# Patient Record
Sex: Female | Born: 2000 | Race: White | Hispanic: No | Marital: Single | Smoking: Never smoker
Health system: Southern US, Community
[De-identification: ages and names within clinical notes are randomized; demographics above are authoritative.]

## PROBLEM LIST (undated history)

## (undated) DIAGNOSIS — N390 Urinary tract infection, site not specified: Secondary | ICD-10-CM

## (undated) DIAGNOSIS — J302 Other seasonal allergic rhinitis: Secondary | ICD-10-CM

## (undated) HISTORY — PX: WISDOM TOOTH EXTRACTION: SHX21

---
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
## 2019-01-16 ENCOUNTER — Encounter: Payer: Self-pay | Admitting: Gastroenterology

## 2019-02-28 ENCOUNTER — Ambulatory Visit: Payer: Self-pay | Admitting: Gastroenterology

## 2019-03-28 ENCOUNTER — Ambulatory Visit: Payer: Self-pay | Admitting: Gastroenterology

## 2019-04-15 ENCOUNTER — Encounter: Payer: Self-pay | Admitting: Family Medicine

## 2021-05-25 ENCOUNTER — Encounter: Payer: BLUE CROSS/BLUE SHIELD | Admitting: Radiology

## 2021-06-02 ENCOUNTER — Encounter: Payer: BLUE CROSS/BLUE SHIELD | Admitting: Radiology

## 2021-06-24 ENCOUNTER — Encounter: Payer: Self-pay | Admitting: Radiology

## 2021-06-24 ENCOUNTER — Ambulatory Visit: Payer: 59 | Admitting: Radiology

## 2021-06-24 VITALS — BP 120/76 | Ht 66.0 in

## 2021-06-24 DIAGNOSIS — R102 Pelvic and perineal pain: Secondary | ICD-10-CM | POA: Diagnosis not present

## 2021-06-24 LAB — URINALYSIS, COMPLETE W/RFL CULTURE
Casts: NONE SEEN /LPF
Crystals: NONE SEEN /HPF
Hgb urine dipstick: NEGATIVE
Leukocyte Esterase: NEGATIVE
Specific Gravity, Urine: 1.02 (ref 1.001–1.035)
Yeast: NONE SEEN /HPF

## 2021-06-24 NOTE — Progress Notes (Signed)
   Laurie Diaz [birth year] [id number]   History:  21 y.o. transfer from [facility] last seen by me 7/22 for AEX. She complains of worsening cramping and bloating with periods over the past 3 months.   Gynecologic History Patient's last menstrual period was 06/10/2021 (exact date). Period Cycle (Days): 28 Period Duration (Days): 6 Period Pattern: Regular Menstrual Flow: Heavy Dysmenorrhea: (!) Severe Dysmenorrhea Symptoms: Cramping Contraception/Family planning: abstinence Sexually active: no Last Pap: 2022. Results were: normal   Obstetric History OB History  Gravida Para Term Preterm AB Living  0 0 0 0 0 0  SAB IAB Ectopic Multiple Live Births  0 0 0 0 0     The following portions of the patient's history were reviewed and updated as appropriate: allergies, current medications, past family history, past medical history, past social history, past surgical history, and problem list.  Review of Systems Pertinent items noted in HPI and remainder of comprehensive ROS otherwise negative.   Past medical history, past surgical history, family history and social history were all reviewed and documented in the EPIC chart.   Exam:  Vitals:   06/24/21 1456  BP: 120/76  Height: 5\' 6"  (1.676 m)   There is no height or weight on file to calculate BMI.  General appearance:  Normal Abdominal  Soft,nontender, without masses, guarding or rebound.  Liver/spleen:  No organomegaly noted  Hernia:  None appreciated  Skin  Inspection:  Grossly normal Genitourinary   Inguinal/mons:  Normal without inguinal adenopathy  External genitalia:  Normal appearing vulva with no masses, tenderness, or lesions  BUS/Urethra/Skene's glands:  Normal without masses or exudate  Vagina:  Normal appearing with normal color and discharge, no lesions  Cervix:  Normal appearing without discharge or lesions  Uterus:  Normal in size, shape and contour.  Mobile, nontender  Adnexa/parametria:     Rt: Normal in  size, without masses or tenderness.   Lt: Normal in size, without masses or tenderness.  Anus and perineum: Normal   Patient informed chaperone available to be present for breast and pelvic exam. Patient has requested no chaperone to be present. Patient has been advised what will be completed during breast and pelvic exam.   Assessment/Plan:   1. Pelvic pain Dysmenorrhea, begin tracking cycles and start Ibuprofen 24hours before starting Declines hormonal management at this time May try magnesium  Acupuncture may help - Pregnancy, urine - Urinalysis,Complete w/RFL Culture    AEX due 7/23  Ruqayyah Lute B WHNP-BC 3:51 PM 06/24/2021

## 2021-06-26 LAB — URINALYSIS, COMPLETE W/RFL CULTURE
Bilirubin Urine: NEGATIVE
Glucose, UA: NEGATIVE
Hyaline Cast: NONE SEEN /LPF
Ketones, ur: NEGATIVE
Nitrites, Initial: NEGATIVE
Protein, ur: NEGATIVE
pH: 5 (ref 5.0–8.0)

## 2021-06-26 LAB — URINE CULTURE
MICRO NUMBER:: 13535697
SPECIMEN QUALITY:: ADEQUATE

## 2021-06-26 LAB — PREGNANCY, URINE: Preg Test, Ur: NEGATIVE

## 2021-06-26 LAB — CULTURE INDICATED
